# Patient Record
Sex: Male | Born: 1960
Health system: Southern US, Community
[De-identification: ages and names within clinical notes are randomized; demographics above are authoritative.]

## PROBLEM LIST (undated history)

## (undated) DIAGNOSIS — M1990 Unspecified osteoarthritis, unspecified site: Secondary | ICD-10-CM

## (undated) HISTORY — DX: Unspecified osteoarthritis, unspecified site: M19.90

---

## 2016-05-07 ENCOUNTER — Other Ambulatory Visit: Payer: Self-pay | Admitting: Infectious Disease

## 2016-05-07 ENCOUNTER — Ambulatory Visit
Admission: RE | Admit: 2016-05-07 | Discharge: 2016-05-07 | Disposition: A | Discharge status: Home / Self Care | Payer: No Typology Code available for payment source | Source: Ambulatory Visit | Attending: Infectious Disease | Admitting: Infectious Disease

## 2016-05-07 DIAGNOSIS — R9389 Abnormal findings on diagnostic imaging of other specified body structures: Secondary | ICD-10-CM

## 2017-10-07 ENCOUNTER — Encounter: Payer: Self-pay | Admitting: Internal Medicine

## 2017-10-07 ENCOUNTER — Ambulatory Visit (INDEPENDENT_AMBULATORY_CARE_PROVIDER_SITE_OTHER): Payer: BLUE CROSS/BLUE SHIELD | Admitting: Internal Medicine

## 2017-10-07 VITALS — BP 110/80 | HR 76 | Temp 98.1°F | Ht 68.0 in | Wt 142.0 lb

## 2017-10-07 DIAGNOSIS — R351 Nocturia: Secondary | ICD-10-CM | POA: Diagnosis not present

## 2017-10-07 DIAGNOSIS — M7918 Myalgia, other site: Secondary | ICD-10-CM | POA: Diagnosis not present

## 2017-10-07 DIAGNOSIS — Z125 Encounter for screening for malignant neoplasm of prostate: Secondary | ICD-10-CM | POA: Diagnosis not present

## 2017-10-07 DIAGNOSIS — Z23 Encounter for immunization: Secondary | ICD-10-CM | POA: Diagnosis not present

## 2017-10-07 DIAGNOSIS — Z0000 Encounter for general adult medical examination without abnormal findings: Secondary | ICD-10-CM | POA: Diagnosis not present

## 2017-10-07 DIAGNOSIS — Z13220 Encounter for screening for lipoid disorders: Secondary | ICD-10-CM

## 2017-10-07 LAB — POCT URINALYSIS DIPSTICK
APPEARANCE: NORMAL
BILIRUBIN UA: NEGATIVE
Blood, UA: NEGATIVE
GLUCOSE UA: NEGATIVE
Ketones, UA: NEGATIVE
Leukocytes, UA: NEGATIVE
Nitrite, UA: NEGATIVE
Odor: NORMAL
Protein, UA: NEGATIVE
Spec Grav, UA: 1.015 (ref 1.010–1.025)
Urobilinogen, UA: 0.2 E.U./dL
pH, UA: 6.5 (ref 5.0–8.0)

## 2017-10-07 MED ORDER — MELOXICAM 15 MG PO TABS: ORAL_TABLET | ORAL | 2 refills | Status: DC

## 2017-10-07 MED ORDER — MELOXICAM 15 MG PO TABS: 15.0000 mg | ORAL_TABLET | Freq: Every day | ORAL | 0 refills | Status: DC

## 2017-10-07 NOTE — Patient Instructions (Signed)
It was a pleasure to see you today.  Flu vaccine given.  Fasting labs drawn and are pending.  Further instructions to follow.

## 2017-10-07 NOTE — Progress Notes (Signed)
   Subjective:    Patient ID: Jake Pierce, male    DOB: 1960-03-23, 57 y.o.   MRN: [id number]  HPI 56 year old [place] Male presents for first time today accompanied by interpreter for health maintenance exam and evaluation of medical issues. Daughter  and son-in-law are patients here.   No history of serious illnesses accidents or operations.  No known drug allergies.  Only takes over-the-counter medication for musculoskeletal pain.  A long time ago in [place] he apparently had an episode of hemoptysis.  At that time he was smoking.  Patient says he has had back and neck pain for over 10 years.  He smoked a pack of cigarettes per day for some 20 or 30 years but quit about 2 years ago.  Occasionally drinks alcohol.  Social history: He is married.  Wife is a homemaker.  He came from [place] city [place] in 2018 with his wife.  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  Family history: Father died at age 69 of heart and stroke issues.  Mother died at age 36 of diabetes.  5 brothers and 2 sisters who apparently all alive and well.  Says he tends to run low blood pressure.  Flu vaccine given today.  He is not sure about tetanus immunization.  There are no immunizations recorded in Epic on him.  I would suggest he get tetanus immunization update at next visit but interpreter says that he likely had tetanus immunization when he entered the country in 2018.       Review of Systems has left lower back pain without radiation to the leg.  Has to urinate perhaps 3 times a night but is able to go back to sleep.  No issues with abdominal pain or constipation.  No shortness of breath.     Objective:   Physical Exam Skin warm and dry.  Nodes none.  HEENT exam: TMs and pharynx are clear.  Dentition is fair.  Neck is supple.  No JVD thyromegaly or carotid bruits.  Chest clear to auscultation.  Cardiac exam regular rate and rhythm normal  S1 and S2 without murmurs or gallops.  Abdomen no hepatosplenomegaly masses or tenderness.  Prostate exam is normal without nodules.  Extremities without deformity or edema.  Neuro no focal deficits on brief neurological exam.  Straight leg raising is negative at 90 degrees bilaterally in lower extremity muscle strength is normal.       Assessment & Plan:  Low back pain likely mechanical from type of work he does.  Have prescribed Meloxicam.  Fasting labs drawn and pending  His blood pressure is a bit low systolically but he feels well and has no symptoms of orthostasis.  Routine health maintenance-labs drawn and pending.  Flu vaccine given.  Daughter requests that we check his Hepatitis B status.  Further instructions to follow once lab work is reviewed.  Nocturia-it does not keep him awake at night and we will continue to monitor.  His prostate does not seem to be very enlarged if at all.

## 2017-10-09 LAB — CBC WITH DIFFERENTIAL/PLATELET
BASOS ABS: 49 {cells}/uL (ref 0–200)
BASOS PCT: 0.9 %
EOS ABS: 637 {cells}/uL — AB (ref 15–500)
Eosinophils Relative: 11.8 %
HEMATOCRIT: 49.8 % (ref 38.5–50.0)
HEMOGLOBIN: 17.2 g/dL — AB (ref 13.2–17.1)
LYMPHS ABS: 1577 {cells}/uL (ref 850–3900)
MCH: 31.8 pg (ref 27.0–33.0)
MCHC: 34.5 g/dL (ref 32.0–36.0)
MCV: 92.1 fL (ref 80.0–100.0)
MONOS PCT: 6.7 %
MPV: 9.6 fL (ref 7.5–12.5)
NEUTROS ABS: 2776 {cells}/uL (ref 1500–7800)
Neutrophils Relative %: 51.4 %
Platelets: 191 10*3/uL (ref 140–400)
RBC: 5.41 10*6/uL (ref 4.20–5.80)
RDW: 12.3 % (ref 11.0–15.0)
Total Lymphocyte: 29.2 %
WBC: 5.4 10*3/uL (ref 3.8–10.8)
WBCMIX: 362 {cells}/uL (ref 200–950)

## 2017-10-09 LAB — COMPLETE METABOLIC PANEL WITH GFR
AG Ratio: 1.7 (calc) (ref 1.0–2.5)
ALT: 17 U/L (ref 9–46)
AST: 20 U/L (ref 10–35)
Albumin: 4.4 g/dL (ref 3.6–5.1)
Alkaline phosphatase (APISO): 66 U/L (ref 40–115)
BUN: 12 mg/dL (ref 7–25)
CALCIUM: 9.7 mg/dL (ref 8.6–10.3)
CO2: 31 mmol/L (ref 20–32)
CREATININE: 0.76 mg/dL (ref 0.70–1.33)
Chloride: 103 mmol/L (ref 98–110)
GFR, EST AFRICAN AMERICAN: 116 mL/min/{1.73_m2} (ref >=60)
GFR, EST NON AFRICAN AMERICAN: 100 mL/min/{1.73_m2} (ref >=60)
Globulin: 2.6 g/dL (calc) (ref 1.9–3.7)
Glucose, Bld: 89 mg/dL (ref 65–99)
POTASSIUM: 4.1 mmol/L (ref 3.5–5.3)
Sodium: 141 mmol/L (ref 135–146)
Total Bilirubin: 0.6 mg/dL (ref 0.2–1.2)
Total Protein: 7 g/dL (ref 6.1–8.1)

## 2017-10-09 LAB — LIPID PANEL
Cholesterol: 217 mg/dL — ABNORMAL HIGH (ref <200)
HDL: 45 mg/dL (ref >40)
LDL Cholesterol (Calc): 118 mg/dL (calc) — ABNORMAL HIGH
Non-HDL Cholesterol (Calc): 172 mg/dL (calc) — ABNORMAL HIGH (ref <130)
Total CHOL/HDL Ratio: 4.8 (calc) (ref <5.0)
Triglycerides: 378 mg/dL — ABNORMAL HIGH (ref <150)

## 2017-10-09 LAB — PSA: PSA: 0.7 ng/mL (ref <=4.0)

## 2017-10-09 LAB — HEPATITIS B E ANTIGEN: Hep B E Ag: NONREACTIVE

## 2017-10-09 LAB — HEPATITIS B SURFACE ANTIBODY,QUALITATIVE: HEP B S AB: REACTIVE — AB

## 2017-10-15 ENCOUNTER — Telehealth: Payer: Self-pay | Admitting: Internal Medicine

## 2017-10-15 MED ORDER — ROSUVASTATIN CALCIUM 5 MG PO TABS: 5.0000 mg | ORAL_TABLET | Freq: Every day | ORAL | 3 refills | Status: DC

## 2017-10-15 NOTE — Telephone Encounter (Signed)
Starting pt on Crestor for hyperlipidemia with follow up in 3 months

## 2018-01-04 ENCOUNTER — Other Ambulatory Visit: Payer: Self-pay

## 2018-01-04 MED ORDER — MELOXICAM 15 MG PO TABS: ORAL_TABLET | ORAL | 5 refills | Status: DC

## 2019-03-16 ENCOUNTER — Telehealth: Payer: Self-pay | Admitting: Internal Medicine

## 2019-03-16 NOTE — Telephone Encounter (Signed)
Received phone call that patient was changing PCP's because they could be seen sooner, and they accept their insurance.

## 2019-04-05 ENCOUNTER — Other Ambulatory Visit: Payer: Self-pay

## 2019-04-05 ENCOUNTER — Encounter: Payer: Self-pay | Admitting: Emergency Medicine

## 2019-04-05 ENCOUNTER — Ambulatory Visit (INDEPENDENT_AMBULATORY_CARE_PROVIDER_SITE_OTHER): Payer: Self-pay | Admitting: Emergency Medicine

## 2019-04-05 VITALS — BP 132/77 | HR 93 | Temp 98.3°F | Resp 16 | Ht 68.9 in | Wt 145.0 lb

## 2019-04-05 DIAGNOSIS — R634 Abnormal weight loss: Secondary | ICD-10-CM

## 2019-04-05 DIAGNOSIS — G8929 Other chronic pain: Secondary | ICD-10-CM | POA: Insufficient documentation

## 2019-04-05 DIAGNOSIS — Z130 Encounter for screening for diseases of the blood and blood-forming organs and certain disorders involving the immune mechanism: Secondary | ICD-10-CM

## 2019-04-05 DIAGNOSIS — Z0001 Encounter for general adult medical examination with abnormal findings: Secondary | ICD-10-CM

## 2019-04-05 DIAGNOSIS — Z0000 Encounter for general adult medical examination without abnormal findings: Secondary | ICD-10-CM

## 2019-04-05 DIAGNOSIS — M545 Low back pain, unspecified: Secondary | ICD-10-CM

## 2019-04-05 DIAGNOSIS — Z1329 Encounter for screening for other suspected endocrine disorder: Secondary | ICD-10-CM

## 2019-04-05 DIAGNOSIS — E785 Hyperlipidemia, unspecified: Secondary | ICD-10-CM | POA: Insufficient documentation

## 2019-04-05 DIAGNOSIS — M549 Dorsalgia, unspecified: Secondary | ICD-10-CM

## 2019-04-05 DIAGNOSIS — Z13228 Encounter for screening for other metabolic disorders: Secondary | ICD-10-CM

## 2019-04-05 LAB — POCT URINALYSIS DIP (MANUAL ENTRY)
Bilirubin, UA: NEGATIVE
Glucose, UA: NEGATIVE mg/dL
Ketones, POC UA: NEGATIVE mg/dL
Leukocytes, UA: NEGATIVE
Nitrite, UA: NEGATIVE
Protein Ur, POC: NEGATIVE mg/dL
Spec Grav, UA: >=1.03 — AB (ref 1.010–1.025)
Urobilinogen, UA: 0.2 E.U./dL
pH, UA: 7 (ref 5.0–8.0)

## 2019-04-05 MED ORDER — MELOXICAM 15 MG PO TABS: ORAL_TABLET | ORAL | 0 refills | Status: DC

## 2019-04-05 MED ORDER — ROSUVASTATIN CALCIUM 5 MG PO TABS: 5.0000 mg | ORAL_TABLET | Freq: Every day | ORAL | 3 refills | Status: DC

## 2019-04-05 MED ORDER — ROSUVASTATIN CALCIUM 10 MG PO TABS: 10.0000 mg | ORAL_TABLET | Freq: Every day | ORAL | 3 refills | Status: AC

## 2019-04-05 MED ORDER — MELOXICAM 15 MG PO TABS: ORAL_TABLET | ORAL | 5 refills | Status: DC

## 2019-04-05 NOTE — Patient Instructions (Addendum)
   If you have lab work done today you will be contacted with your lab results within the next 2 weeks.  If you have not heard from us then please contact us. The fastest way to get your results is to register for My Chart.   IF you received an x-ray today, you will receive an invoice from [facility]. Please contact [facility] at [phone] with questions or concerns regarding your invoice.   IF you received labwork today, you will receive an invoice from [facility]. Please contact [facility] at [phone] with questions or concerns regarding your invoice.   Our billing staff will not be able to assist you with questions regarding bills from these companies.  You will be contacted with the lab results as soon as they are available. The fastest way to get your results is to activate your My Chart account. Instructions are located on the last page of this paperwork. If you have not heard from us regarding the results in 2 weeks, please contact this office.      Health Maintenance, Male Adopting a healthy lifestyle and getting preventive care are important in promoting health and wellness. Ask your health care provider about:  The right schedule for you to have regular tests and exams.  Things you can do on your own to prevent diseases and keep yourself healthy. What should I know about diet, weight, and exercise? Eat a healthy diet   Eat a diet that includes plenty of vegetables, fruits, low-fat dairy products, and lean protein.  Do not eat a lot of foods that are high in solid fats, added sugars, or sodium. Maintain a healthy weight Body mass index (BMI) is a measurement that can be used to identify possible weight problems. It estimates body fat based on height and weight. Your health care provider can help determine your BMI and help you achieve or maintain a healthy weight. Get regular exercise Get regular exercise. This is one of the most important things you  can do for your health. Most adults should:  Exercise for at least 150 minutes each week. The exercise should increase your heart rate and make you sweat (moderate-intensity exercise).  Do strengthening exercises at least twice a week. This is in addition to the moderate-intensity exercise.  Spend less time sitting. Even light physical activity can be beneficial. Watch cholesterol and blood lipids Have your blood tested for lipids and cholesterol at 61 years of age, then have this test every 5 years. You may need to have your cholesterol levels checked more often if:  Your lipid or cholesterol levels are high.  You are older than 61 years of age.  You are at high risk for heart disease. What should I know about cancer screening? Many types of cancers can be detected early and may often be prevented. Depending on your health history and family history, you may need to have cancer screening at various ages. This may include screening for:  Colorectal cancer.  Prostate cancer.  Skin cancer.  Lung cancer. What should I know about heart disease, diabetes, and high blood pressure? Blood pressure and heart disease  High blood pressure causes heart disease and increases the risk of stroke. This is more likely to develop in people who have high blood pressure readings, are of African descent, or are overweight.  Talk with your health care provider about your target blood pressure readings.  Have your blood pressure checked: ? Every 3-5 years if you are 18-39   years of age. ? Every year if you are 40 years old or older.  If you are between the ages of 65 and 75 and are a current or former smoker, ask your health care provider if you should have a one-time screening for abdominal aortic aneurysm (AAA). Diabetes Have regular diabetes screenings. This checks your fasting blood sugar level. Have the screening done:  Once every three years after age 45 if you are at a normal weight and have  a low risk for diabetes.  More often and at a younger age if you are overweight or have a high risk for diabetes. What should I know about preventing infection? Hepatitis B If you have a higher risk for hepatitis B, you should be screened for this virus. Talk with your health care provider to find out if you are at risk for hepatitis B infection. Hepatitis C Blood testing is recommended for:  Everyone born from 1945 through 1965.  Anyone with known risk factors for hepatitis C. Sexually transmitted infections (STIs)  You should be screened each year for STIs, including gonorrhea and chlamydia, if: ? You are sexually active and are younger than 61 years of age. ? You are older than 61 years of age and your health care provider tells you that you are at risk for this type of infection. ? Your sexual activity has changed since you were last screened, and you are at increased risk for chlamydia or gonorrhea. Ask your health care provider if you are at risk.  Ask your health care provider about whether you are at high risk for HIV. Your health care provider may recommend a prescription medicine to help prevent HIV infection. If you choose to take medicine to prevent HIV, you should first get tested for HIV. You should then be tested every 3 months for as long as you are taking the medicine. Follow these instructions at home: Lifestyle  Do not use any products that contain nicotine or tobacco, such as cigarettes, e-cigarettes, and chewing tobacco. If you need help quitting, ask your health care provider.  Do not use street drugs.  Do not share needles.  Ask your health care provider for help if you need support or information about quitting drugs. Alcohol use  Do not drink alcohol if your health care provider tells you not to drink.  If you drink alcohol: ? Limit how much you have to 0-2 drinks a day. ? Be aware of how much alcohol is in your drink. In the [place] one drink equals one 12  oz bottle of beer (355 mL), one 5 oz glass of wine (148 mL), or one 1 oz glass of hard liquor (44 mL). General instructions  Schedule regular health, dental, and eye exams.  Stay current with your vaccines.  Tell your health care provider if: ? You often feel depressed. ? You have ever been abused or do not feel safe at home. Summary  Adopting a healthy lifestyle and getting preventive care are important in promoting health and wellness.  Follow your health care provider's instructions about healthy diet, exercising, and getting tested or screened for diseases.  Follow your health care provider's instructions on monitoring your cholesterol and blood pressure. This information is not intended to replace advice given to you by your health care provider. Make sure you discuss any questions you have with your health care provider. Document Revised: 01/06/2018 Document Reviewed: 01/06/2018 Elsevier Patient Education  2020 Elsevier Inc.  

## 2019-04-05 NOTE — Progress Notes (Signed)
Jake Pierce 59 y.o.   Chief Complaint  Patient presents with  . Establish Care    loss of weight  . Medication Refill    Meloxicam for back pain and Rosuvastatin    HISTORY OF PRESENT ILLNESS: This is a 59 y.o. male first visit to this office, here to establish care with me. Has a history of dyslipidemia, on Crestor 5 mg daily. Also has a history of chronic lumbar pain most likely related to very physical work.  Patient is a Corporate investment banker. Some concern about weight loss. Wt Readings from Last 3 Encounters:  04/05/19 145 lb (65.8 kg)  10/07/17 142 lb (64.4 kg)  Patient has no other complaints or medical concerns.  Using interpreter for [place] language. Patient states he feels fine, has good appetite, is eating well.  No urinary or bowel symptoms.   HPI   Prior to Admission medications   Medication Sig Start Date End Date Taking? Authorizing Provider  meloxicam (MOBIC) 15 MG tablet One po daily with food for musculoskeletal pain 04/05/19  Yes Shylo Zamor, Eilleen Kempf, MD  rosuvastatin (CRESTOR) 5 MG tablet Take 1 tablet (5 mg total) by mouth daily. 04/05/19  Yes Chundra Sauerwein, Eilleen Kempf, MD    No Known Allergies  There are no problems to display for this patient.   History reviewed. No pertinent past medical history.  History reviewed. No pertinent surgical history.  Social History   Socioeconomic History  . Marital status: Married    Spouse name: Not on file  . Number of children: Not on file  . Years of education: Not on file  . Highest education level: Not on file  Occupational History  . Not on file  Tobacco Use  . Smoking status: Former Games developer  . Smokeless tobacco: Never Used  Substance and Sexual Activity  . Alcohol use: Not Currently  . Drug use: Not on file  . Sexual activity: Not on file  Other Topics Concern  . Not on file  Social History Narrative  . Not on file   Social Determinants of Health   Financial Resource Strain:   . Difficulty  of Paying Living Expenses: Not on file  Food Insecurity:   . Worried About Programme researcher, broadcasting/film/video in the Last Year: Not on file  . Ran Out of Food in the Last Year: Not on file  Transportation Needs:   . Lack of Transportation (Medical): Not on file  . Lack of Transportation (Non-Medical): Not on file  Physical Activity:   . Days of Exercise per Week: Not on file  . Minutes of Exercise per Session: Not on file  Stress:   . Feeling of Stress : Not on file  Social Connections:   . Frequency of Communication with Friends and Family: Not on file  . Frequency of Social Gatherings with Friends and Family: Not on file  . Attends Religious Services: Not on file  . Active Member of Clubs or Organizations: Not on file  . Attends Banker Meetings: Not on file  . Marital Status: Not on file  Intimate Partner Violence:   . Fear of Current or Ex-Partner: Not on file  . Emotionally Abused: Not on file  . Physically Abused: Not on file  . Sexually Abused: Not on file    History reviewed. No pertinent family history.   Review of Systems  Constitutional: Negative.  Negative for chills and fever.  HENT: Negative.  Negative for congestion and sore throat.  Eyes: Negative.   Respiratory: Negative.  Negative for cough, hemoptysis and shortness of breath.   Cardiovascular: Negative.  Negative for chest pain and palpitations.  Gastrointestinal: Negative.  Negative for abdominal pain, blood in stool, diarrhea, melena, nausea and vomiting.  Genitourinary: Negative.  Negative for dysuria and hematuria.  Musculoskeletal: Positive for back pain. Negative for myalgias and neck pain.  Skin: Negative.  Negative for rash.  Neurological: Negative for dizziness and headaches.  Endo/Heme/Allergies: Negative.   All other systems reviewed and are negative.  Today's Vitals   04/05/19 1325  BP: 132/77  Pulse: 93  Resp: 16  Temp: 98.3 F (36.8 C)  TempSrc: Temporal  SpO2: 98%  Weight: 145 lb  (65.8 kg)  Height: 5' 8.9" (1.75 m)   Body mass index is 21.48 kg/m.   Physical Exam Vitals reviewed.  Constitutional:      Appearance: Normal appearance.  HENT:     Head: Normocephalic.  Eyes:     Extraocular Movements: Extraocular movements intact.     Conjunctiva/sclera: Conjunctivae normal.     Pupils: Pupils are equal, round, and reactive to light.  Neck:     Vascular: No carotid bruit.  Cardiovascular:     Rate and Rhythm: Normal rate and regular rhythm.     Pulses: Normal pulses.     Heart sounds: Normal heart sounds.  Pulmonary:     Effort: Pulmonary effort is normal.     Breath sounds: Normal breath sounds.  Abdominal:     General: Bowel sounds are normal. There is no distension.     Palpations: Abdomen is soft.     Tenderness: There is no abdominal tenderness.  Musculoskeletal:        General: Normal range of motion.     Cervical back: Normal, normal range of motion and neck supple. No tenderness.     Thoracic back: Normal.     Lumbar back: Normal.  Lymphadenopathy:     Cervical: No cervical adenopathy.  Skin:    General: Skin is warm and dry.     Capillary Refill: Capillary refill takes less than 2 seconds.  Neurological:     General: No focal deficit present.     Mental Status: He is alert and oriented to person, place, and time.  Psychiatric:        Mood and Affect: Mood normal.        Behavior: Behavior normal.      ASSESSMENT & PLAN: Jake was seen today for establish care and medication refill.  Diagnoses and all orders for this visit:  Routine general medical examination at a health care facility -     Comprehensive metabolic panel -     CBC with Differential/Platelet  Dyslipidemia -     Lipid panel -     Discontinue: rosuvastatin (CRESTOR) 5 MG tablet; Take 1 tablet (5 mg total) by mouth daily. -     rosuvastatin (CRESTOR) 10 MG tablet; Take 1 tablet (10 mg total) by mouth daily.  Chronic bilateral low back pain without sciatica -      POCT urinalysis dipstick  Musculoskeletal back pain -     Discontinue: meloxicam (MOBIC) 15 MG tablet; One po daily with food for musculoskeletal pain -     meloxicam (MOBIC) 15 MG tablet; Daily as needed for low back pain  Loss of weight -     PSA(Must document that pt has been informed of limitations of PSA testing.) -     TSH  Screening for deficiency anemia  Screening for endocrine, metabolic and immunity disorder -     PSA(Must document that pt has been informed of limitations of PSA testing.) -     TSH    Patient Instructions       If you have lab work done today you will be contacted with your lab results within the next 2 weeks.  If you have not heard from us then please contact us. The fastest way to get your results is to register for My Chart.   IF you received an x-ray today, you will receive an invoice [facility]. Please contact [facility] at [phone] with questions or concerns regarding your invoice.   IF you received labwork today, you will receive an invoice from [facility]. Please contact [facility] at [phone] with questions or concerns regarding your invoice.   Our billing staff will not be able to assist you with questions regarding bills from these companies.  You will be contacted with the lab results as soon as they are available. The fastest way to get your results is to activate your My Chart account. Instructions are located on the last page of this paperwork. If you have not heard from us regarding the results in 2 weeks, please contact this office.      Health Maintenance, Male Adopting a healthy lifestyle and getting preventive care are important in promoting health and wellness. Ask your health care provider about:  The right schedule for you to have regular tests and exams.  Things you can do on your own to prevent diseases and keep yourself healthy. What should I know about diet, weight, and exercise? Eat a  healthy diet   Eat a diet that includes plenty of vegetables, fruits, low-fat dairy products, and lean protein.  Do not eat a lot of foods that are high in solid fats, added sugars, or sodium. Maintain a healthy weight Body mass index (BMI) is a measurement that can be used to identify possible weight problems. It estimates body fat based on height and weight. Your health care provider can help determine your BMI and help you achieve or maintain a healthy weight. Get regular exercise Get regular exercise. This is one of the most important things you can do for your health. Most adults should:  Exercise for at least 150 minutes each week. The exercise should increase your heart rate and make you sweat (moderate-intensity exercise).  Do strengthening exercises at least twice a week. This is in addition to the moderate-intensity exercise.  Spend less time sitting. Even light physical activity can be beneficial. Watch cholesterol and blood lipids Have your blood tested for lipids and cholesterol at 61 years of age, then have this test every 5 years. You may need to have your cholesterol levels checked more often if:  Your lipid or cholesterol levels are high.  You are older than 61 years of age.  You are at high risk for heart disease. What should I know about cancer screening? Many types of cancers can be detected early and may often be prevented. Depending on your health history and family history, you may need to have cancer screening at various ages. This may include screening for:  Colorectal cancer.  Prostate cancer.  Skin cancer.  Lung cancer. What should I know about heart disease, diabetes, and high blood pressure? Blood pressure and heart disease  High blood pressure causes heart disease and increases the risk of stroke. This is more likely to  develop in people who have high blood pressure readings, are of African descent, or are overweight.  Talk with your health care  provider about your target blood pressure readings.  Have your blood pressure checked: ? Every 3-5 years if you are 74-28 years of age. ? Every year if you are 88 years old or older.  If you are between the ages of 62 and 60 and are a current or former smoker, ask your health care provider if you should have a one-time screening for abdominal aortic aneurysm (AAA). Diabetes Have regular diabetes screenings. This checks your fasting blood sugar level. Have the screening done:  Once every three years after age 71 if you are at a normal weight and have a low risk for diabetes.  More often and at a younger age if you are overweight or have a high risk for diabetes. What should I know about preventing infection? Hepatitis B If you have a higher risk for hepatitis B, you should be screened for this virus. Talk with your health care provider to find out if you are at risk for hepatitis B infection. Hepatitis C Blood testing is recommended for:  Everyone born from [birth year] through 1965.  Anyone with known risk factors for hepatitis C. Sexually transmitted infections (STIs)  You should be screened each year for STIs, including gonorrhea and chlamydia, if: ? You are sexually active and are younger than 61 years of age. ? You are older than 61 years of age and your health care provider tells you that you are at risk for this type of infection. ? Your sexual activity has changed since you were last screened, and you are at increased risk for chlamydia or gonorrhea. Ask your health care provider if you are at risk.  Ask your health care provider about whether you are at high risk for HIV. Your health care provider may recommend a prescription medicine to help prevent HIV infection. If you choose to take medicine to prevent HIV, you should first get tested for HIV. You should then be tested every 3 months for as long as you are taking the medicine. Follow these instructions at home: Lifestyle  Do not  use any products that contain nicotine or tobacco, such as cigarettes, e-cigarettes, and chewing tobacco. If you need help quitting, ask your health care provider.  Do not use street drugs.  Do not share needles.  Ask your health care provider for help if you need support or information about quitting drugs. Alcohol use  Do not drink alcohol if your health care provider tells you not to drink.  If you drink alcohol: ? Limit how much you have to 0-2 drinks a day. ? Be aware of how much alcohol is in your drink. In the [place] one drink equals one 12 oz bottle of beer (355 mL), one 5 oz glass of wine (148 mL), or one 1 oz glass of hard liquor (44 mL). General instructions  Schedule regular health, dental, and eye exams.  Stay current with your vaccines.  Tell your health care provider if: ? You often feel depressed. ? You have ever been abused or do not feel safe at home. Summary  Adopting a healthy lifestyle and getting preventive care are important in promoting health and wellness.  Follow your health care provider's instructions about healthy diet, exercising, and getting tested or screened for diseases.  Follow your health care provider's instructions on monitoring your cholesterol and blood pressure. This information is not intended  to replace advice given to you by your health care provider. Make sure you discuss any questions you have with your health care provider. Document Revised: 01/06/2018 Document Reviewed: 01/06/2018 Elsevier Patient Education  2020 Elsevier Inc.      Edwina Barth, MD [facility]

## 2019-04-06 ENCOUNTER — Encounter: Payer: Self-pay | Admitting: Emergency Medicine

## 2019-04-06 ENCOUNTER — Encounter: Payer: Self-pay | Admitting: Radiology

## 2019-04-06 LAB — CBC WITH DIFFERENTIAL/PLATELET
Basophils Absolute: 0 10*3/uL (ref 0.0–0.2)
Basos: 1 %
EOS (ABSOLUTE): 0.5 10*3/uL — ABNORMAL HIGH (ref 0.0–0.4)
Eos: 9 %
Hematocrit: 45.2 % (ref 37.5–51.0)
Hemoglobin: 15.8 g/dL (ref 13.0–17.7)
Immature Grans (Abs): 0 10*3/uL (ref 0.0–0.1)
Immature Granulocytes: 1 %
Lymphocytes Absolute: 1.3 10*3/uL (ref 0.7–3.1)
Lymphs: 24 %
MCH: 33 pg (ref 26.6–33.0)
MCHC: 35 g/dL (ref 31.5–35.7)
MCV: 94 fL (ref 79–97)
Monocytes Absolute: 0.5 10*3/uL (ref 0.1–0.9)
Monocytes: 9 %
Neutrophils Absolute: 3.1 10*3/uL (ref 1.4–7.0)
Neutrophils: 56 %
Platelets: 189 10*3/uL (ref 150–450)
RBC: 4.79 x10E6/uL (ref 4.14–5.80)
RDW: 12.8 % (ref 11.6–15.4)
WBC: 5.4 10*3/uL (ref 3.4–10.8)

## 2019-04-06 LAB — COMPREHENSIVE METABOLIC PANEL
ALT: 18 IU/L (ref 0–44)
AST: 19 IU/L (ref 0–40)
Albumin/Globulin Ratio: 1.8 (ref 1.2–2.2)
Albumin: 4.2 g/dL (ref 3.8–4.9)
Alkaline Phosphatase: 85 IU/L (ref 39–117)
BUN/Creatinine Ratio: 25 — ABNORMAL HIGH (ref 10–24)
BUN: 17 mg/dL (ref 8–27)
Bilirubin Total: 0.3 mg/dL (ref 0.0–1.2)
CO2: 23 mmol/L (ref 20–29)
Calcium: 9.3 mg/dL (ref 8.6–10.2)
Chloride: 104 mmol/L (ref 96–106)
Creatinine, Ser: 0.68 mg/dL — ABNORMAL LOW (ref 0.76–1.27)
GFR calc Af Amer: 120 mL/min/{1.73_m2} (ref >59)
GFR calc non Af Amer: 104 mL/min/{1.73_m2} (ref >59)
Globulin, Total: 2.4 g/dL (ref 1.5–4.5)
Glucose: 111 mg/dL — ABNORMAL HIGH (ref 65–99)
Potassium: 3.8 mmol/L (ref 3.5–5.2)
Sodium: 139 mmol/L (ref 134–144)
Total Protein: 6.6 g/dL (ref 6.0–8.5)

## 2019-04-06 LAB — LIPID PANEL
Chol/HDL Ratio: 3.9 ratio (ref 0.0–5.0)
Cholesterol, Total: 221 mg/dL — ABNORMAL HIGH (ref 100–199)
HDL: 57 mg/dL (ref >39)
LDL Chol Calc (NIH): 126 mg/dL — ABNORMAL HIGH (ref 0–99)
Triglycerides: 218 mg/dL — ABNORMAL HIGH (ref 0–149)
VLDL Cholesterol Cal: 38 mg/dL (ref 5–40)

## 2019-04-06 LAB — PSA: Prostate Specific Ag, Serum: 0.6 ng/mL (ref 0.0–4.0)

## 2019-04-06 LAB — TSH: TSH: 0.637 u[IU]/mL (ref 0.450–4.500)

## 2019-04-12 ENCOUNTER — Encounter: Payer: Self-pay | Admitting: *Deleted

## 2019-05-24 ENCOUNTER — Encounter: Payer: BLUE CROSS/BLUE SHIELD | Admitting: Internal Medicine

## 2019-09-26 ENCOUNTER — Other Ambulatory Visit: Payer: Self-pay

## 2019-09-26 ENCOUNTER — Telehealth: Payer: Self-pay | Admitting: Emergency Medicine

## 2019-09-27 ENCOUNTER — Encounter: Payer: Self-pay | Admitting: Emergency Medicine

## 2019-10-04 ENCOUNTER — Ambulatory Visit: Payer: 59 | Admitting: Emergency Medicine

## 2019-10-05 ENCOUNTER — Encounter: Payer: Self-pay | Admitting: Emergency Medicine

## 2019-10-12 ENCOUNTER — Telehealth: Payer: Self-pay | Admitting: Emergency Medicine

## 2020-04-24 ENCOUNTER — Encounter: Payer: 59 | Admitting: Emergency Medicine

## 2020-05-16 ENCOUNTER — Encounter: Payer: 59 | Admitting: Emergency Medicine

## 2020-07-04 ENCOUNTER — Other Ambulatory Visit: Payer: Self-pay | Admitting: Physician Assistant

## 2020-07-04 DIAGNOSIS — Z87891 Personal history of nicotine dependence: Secondary | ICD-10-CM

## 2020-08-10 ENCOUNTER — Other Ambulatory Visit: Payer: Self-pay

## 2020-08-10 ENCOUNTER — Ambulatory Visit
Admission: RE | Admit: 2020-08-10 | Discharge: 2020-08-10 | Disposition: A | Discharge status: Home / Self Care | Payer: 59 | Source: Ambulatory Visit | Attending: Physician Assistant | Admitting: Physician Assistant

## 2020-08-10 DIAGNOSIS — Z87891 Personal history of nicotine dependence: Secondary | ICD-10-CM

## 2021-05-27 ENCOUNTER — Ambulatory Visit
Admission: EM | Admit: 2021-05-27 | Discharge: 2021-05-27 | Disposition: A | Discharge status: Home / Self Care | Payer: 59

## 2021-05-27 ENCOUNTER — Emergency Department (HOSPITAL_BASED_OUTPATIENT_CLINIC_OR_DEPARTMENT_OTHER): Payer: 59 | Admitting: Radiology

## 2021-05-27 ENCOUNTER — Encounter: Payer: Self-pay | Admitting: Emergency Medicine

## 2021-05-27 ENCOUNTER — Other Ambulatory Visit: Payer: Self-pay

## 2021-05-27 ENCOUNTER — Encounter (HOSPITAL_BASED_OUTPATIENT_CLINIC_OR_DEPARTMENT_OTHER): Payer: Self-pay | Admitting: Emergency Medicine

## 2021-05-27 ENCOUNTER — Emergency Department (HOSPITAL_BASED_OUTPATIENT_CLINIC_OR_DEPARTMENT_OTHER)
Admission: EM | Admit: 2021-05-27 | Discharge: 2021-05-27 | Disposition: A | Discharge status: Home / Self Care | Payer: 59 | Attending: Emergency Medicine | Admitting: Emergency Medicine

## 2021-05-27 DIAGNOSIS — S61214A Laceration without foreign body of right ring finger without damage to nail, initial encounter: Secondary | ICD-10-CM | POA: Diagnosis not present

## 2021-05-27 DIAGNOSIS — S6991XA Unspecified injury of right wrist, hand and finger(s), initial encounter: Secondary | ICD-10-CM | POA: Diagnosis present

## 2021-05-27 DIAGNOSIS — W25XXXA Contact with sharp glass, initial encounter: Secondary | ICD-10-CM | POA: Diagnosis not present

## 2021-05-27 MED ORDER — CEPHALEXIN 250 MG PO CAPS
500.0000 mg | ORAL_CAPSULE | Freq: Once | ORAL | Status: AC
Start: 2021-05-27 — End: 2021-05-27
  Administered 2021-05-27: 500 mg via ORAL
  Filled 2021-05-27: qty 2

## 2021-05-27 MED ORDER — LIDOCAINE HCL (PF) 1 % IJ SOLN
10.0000 mL | Freq: Once | INTRAMUSCULAR | Status: AC
Start: 2021-05-27 — End: 2021-05-27
  Administered 2021-05-27: 10 mL via INTRADERMAL
  Filled 2021-05-27: qty 10

## 2021-05-27 MED ORDER — CEPHALEXIN 500 MG PO CAPS: 500.0000 mg | ORAL_CAPSULE | Freq: Two times a day (BID) | ORAL | 0 refills | Status: AC

## 2021-05-27 NOTE — ED Notes (Signed)
ED Provider at bedside. 

## 2021-05-27 NOTE — ED Triage Notes (Signed)
Pt was cut on glass in trash can when he was pushing trash down. Pt has bandage on his right 4th finger; was seen at urgent care and sent here. Pt reports numbness to his entire hand. Unknown date of last tetanus shot. Pt alert & oriented, using [place] interpreter.  ?

## 2021-05-27 NOTE — ED Provider Notes (Signed)
Patient presents to urgent care today complaining of a laceration on his right fourth finger that occurred after attempting to compress trash in the trash can.  Patient states he believes he may have cut his finger on a can that was in the trash can.  Patient does not recall the last time he had a Tdap vaccination.  Patient endorses numbness and decreased strength in that finger.  Limited physical examination confirms this as well as a 1.5 cm laceration approximately 2 mm deep on the medial aspect of the PIP of his right fourth finger.  Patient does demonstrate full range of motion but grip strength is decreased in that finger and patient has loss of pinprick sensation.  Wound was rewrapped in a nonstick Telfa pad and Coban.  Patient was advised to the emergency room for further evaluation of nerve damage secondary to the injury.  Patient is with a young woman who, to the best of my ability seems, to be his daughter-in-law, she agrees to take him to the emergency room now. ?  ?Theadora Rama Scales, PA-C ?05/27/21 1616 ? ?

## 2021-05-27 NOTE — ED Notes (Signed)
Pt NAD, a/ox4. Pt verbalizes understanding of all DC and f/u instructions in vietnamese with MD Trifan and interpreter service. All questions answered. Pt walks with steady gait to lobby at DC.  ? ?

## 2021-05-27 NOTE — Discharge Instructions (Addendum)
The stitches (4) need to be removed in 7-10 days (May 8th-10th).  This can be done at an urgent care or the doctor's office. ?

## 2021-05-27 NOTE — ED Notes (Signed)
Patient is being discharged from the Urgent Care and sent to the Emergency Department via Rosa Sanchez [facility] . Per Delman Cheadle, patient is in need of higher level of care due to Laceration and Loss of sesnsation. Patient is aware and verbalizes understanding of plan of care.  ?Vitals:  ? 05/27/21 1600  ?BP: 139/89  ?Pulse: 94  ?Resp: 16  ?Temp: (!) 97.4 ?F (36.3 ?C)  ?SpO2: 95%  ?  ?

## 2021-05-27 NOTE — ED Triage Notes (Addendum)
Patient presents with Laceration on RT hand.  ? ?Patient endorses numbness in hand. ? ?Patient placed his hand in the trash and was cut my a can.  ? ?Patient has hand wrapped currently.  ? ?Patient doesn't remember last Tdap vaccination.  ?

## 2021-05-27 NOTE — ED Provider Notes (Signed)
?[facility] EM[facility]PT ?Provider Note ? ? ?CSN: [id number] ?Arrival date & time: 05/27/21  1634 ? ?  ? ?History ? ?Chief Complaint  ?Patient presents with  ? Extremity Laceration  ? ? ?Jake Pierce is a 61 y.o. male who is vietnamese speaking presenting to the emergency department with an injury to his right fourth finger.  He reports that he was taking out the trash earlier today and he cut his finger on the edge of a sharp can.  He says he put a tourniquet on his finger and tied a piece of string around the base of the finger for approximately an hour to slow and stop the bleeding.  Subsequently when he took it off he was seen at urgent care, and he was having numbness of his entire finger, and he was referred to the ED for further evaluation due to this numbness.  He feels the sensation is improving and coming back and almost back to normal in the finger.  He is right-handed.  He does not actively work. ? ?HPI ? ?  ? ?Home Medications ?Prior to Admission medications   ?Medication Sig Start Date End Date Taking? Authorizing Provider  ?cephALEXin (KEFLEX) 500 MG capsule Take 1 capsule (500 mg total) by mouth 2 (two) times daily for 5 days. 05/27/21 06/01/21 Yes Dorell Gatlin, Kermit Balo, MD  ?rosuvastatin (CRESTOR) 10 MG tablet Take 1 tablet (10 mg total) by mouth daily. 04/05/19 05/27/21  Georgina Quint, MD  ?   ? ?Allergies    ?Patient has no known allergies.   ? ?Review of Systems   ?Review of Systems ? ?Physical Exam ?Updated Vital Signs ?BP (!) 162/96 (BP Location: Left Leg)   Pulse 87   Temp 97.9 ?F (36.6 ?C)   Resp 14   Ht 5\' 8"  (1.727 m)   Wt 71.2 kg   SpO2 100%   BMI 23.87 kg/m?  ?Physical Exam ?Constitutional:   ?   General: He is not in acute distress. ?HENT:  ?   Head: Normocephalic and atraumatic.  ?Eyes:  ?   Conjunctiva/sclera: Conjunctivae normal.  ?   Pupils: Pupils are equal, round, and reactive to light.  ?Cardiovascular:  ?   Rate and Rhythm: Normal rate and regular rhythm.   ?Pulmonary:  ?   Effort: Pulmonary effort is normal. No respiratory distress.  ?Abdominal:  ?   General: There is no distension.  ?   Tenderness: There is no abdominal tenderness.  ?Skin: ?   General: Skin is warm and dry.  ?   Comments: 1 cm laceration just proximal to the right 4th PIP ?Patient is able to fully extend all fingers, he is able to flex his 4th finger into a near fist but not complete ?Sensation (gross and fine touch) is preserved  ?Neurological:  ?   General: No focal deficit present.  ?   Mental Status: He is alert. Mental status is at baseline.  ?Psychiatric:     ?   Mood and Affect: Mood normal.     ?   Behavior: Behavior normal.  ? ? ?ED Results / Procedures / Treatments   ?Labs ?(all labs ordered are listed, but only abnormal results are displayed) ?Labs Reviewed - No data to display ? ?EKG ?None ? ?Radiology ?DG Finger Ring Right ? ?Result Date: 05/27/2021 ?CLINICAL DATA:  Chronic glass in trash can EXAM: RIGHT RING FINGER 2+V COMPARISON:  None. FINDINGS: There is no acute fracture or dislocation. Bony alignment is  normal. The joint spaces are preserved. There is no erosive change. There is no soft tissue gas or radiopaque foreign body. IMPRESSION: No acute fracture or dislocation.  No radiopaque foreign body. Electronically Signed   By: Lesia HausenPeter  Noone M.D.   On: 05/27/2021 17:40   ? ?Procedures ?Marland Kitchen..Laceration Repair ? ?Date/Time: 05/27/2021 6:15 PM ?Performed by: Terald Sleeperrifan, Shean Gerding J, MD ?Authorized by: Terald Sleeperrifan, Tarri Guilfoil J, MD  ? ?Consent:  ?  Consent obtained:  Verbal ?  Consent given by:  Patient ?  Risks, benefits, and alternatives were discussed: yes   ?  Risks discussed:  Pain and infection ?Universal protocol:  ?  Procedure explained and questions answered to patient or proxy's satisfaction: yes   ?  Immediately prior to procedure, a time out was called: yes   ?  Patient identity confirmed:  Arm band ?Anesthesia:  ?  Anesthesia method:  Nerve block ?  Block location:  Digital block ?  Block needle  gauge:  25 G ?  Block anesthetic:  Lidocaine 1% w/o epi ?  Block injection procedure:  Anatomic landmarks identified, anatomic landmarks palpated, introduced needle, negative aspiration for blood and incremental injection ?  Block outcome:  Anesthesia achieved ?Laceration details:  ?  Location:  Finger ?  Finger location:  R ring finger ?  Length (cm):  1 ?Pre-procedure details:  ?  Preparation:  Patient was prepped and draped in usual sterile fashion ?Exploration:  ?  Limited defect created (wound extended): no   ?  Imaging obtained: x-ray   ?  Imaging outcome: foreign body not noted   ?  Wound extent: no muscle damage noted and no vascular damage noted   ?  Contaminated: yes   ?Treatment:  ?  Area cleansed with:  Saline ?  Amount of cleaning:  Standard ?  Irrigation solution:  Sterile saline ?  Irrigation method:  Pressure wash ?  Debridement:  None ?Skin repair:  ?  Repair method:  Sutures ?  Suture size:  4-0 ?  Suture material:  Prolene ?  Suture technique:  Simple interrupted ?  Number of sutures:  4 ?Approximation:  ?  Approximation:  Close ?Repair type:  ?  Repair type:  Simple  ? ? ?Medications Ordered in ED ?Medications  ?lidocaine (PF) (XYLOCAINE) 1 % injection 10 mL (has no administration in time range)  ?cephALEXin (KEFLEX) capsule 500 mg (has no administration in time range)  ? ? ?ED Course/ Medical Decision Making/ A&P ?  ?                        ?Medical Decision Making ?Amount and/or Complexity of Data Reviewed ?Radiology: ordered. ? ?Risk ?Prescription drug management. ? ? ?Patient is an isolated injury to his finger.  X-rays of the finger ordered and reviewed, no retained foreign body or underlying fracture.  He is grossly neurovascularly intact on exam, no active bleeding, there is some question about him being able to make a complete fist (flexion), which may be limited by the pain he is experiencing.  The wound was irrigated and closed with stitches the bedside.  He will be started on Keflex.   He received tetanus at urgent care per my review of the records.  A hand surgeon phone number was provided if he is still has difficulty fully flexing or extending the finger after the swelling is gone down in 2 to 3 days.  If he enemies translator service was used for the  entirety of my history and exam.  The patient's daughter was also present at the bedside.  They verbalized understanding ? ? ? ? ? ? ? ?Final Clinical Impression(s) / ED Diagnoses ?Final diagnoses:  ?Laceration of right ring finger without damage to nail, foreign body presence unspecified, initial encounter  ? ? ?Rx / DC Orders ?ED Discharge Orders   ? ?      Ordered  ?  cephALEXin (KEFLEX) 500 MG capsule  2 times daily       ? 05/27/21 1815  ? ?  ?  ? ?  ? ? ?  ?Terald Sleeper, MD ?05/27/21 1817 ? ?

## 2021-06-03 NOTE — Telephone Encounter (Signed)
NA

## 2021-06-05 ENCOUNTER — Ambulatory Visit
Admission: EM | Admit: 2021-06-05 | Discharge: 2021-06-05 | Disposition: A | Discharge status: Home / Self Care | Payer: 59

## 2021-06-05 NOTE — ED Notes (Addendum)
Four sutures removed with no issues.  ?

## 2021-06-05 NOTE — ED Triage Notes (Signed)
Patient presents today for suture removal. Patient has 4 sutures that are well healed, patient denies having any issues to the site.  ?

## 2021-06-05 NOTE — Discharge Instructions (Signed)
Please monitor for signs of infection (drainage, odor, redness, and swelling).  If you notice any of these symptoms or have complications to the area please return for re-evaluation.  

## 2021-07-10 ENCOUNTER — Other Ambulatory Visit: Payer: Self-pay | Admitting: Physician Assistant

## 2021-07-10 DIAGNOSIS — Z87891 Personal history of nicotine dependence: Secondary | ICD-10-CM

## 2021-08-14 ENCOUNTER — Ambulatory Visit: Payer: 59

## 2021-08-15 ENCOUNTER — Ambulatory Visit (INDEPENDENT_AMBULATORY_CARE_PROVIDER_SITE_OTHER): Payer: 59

## 2021-08-15 DIAGNOSIS — Z122 Encounter for screening for malignant neoplasm of respiratory organs: Secondary | ICD-10-CM | POA: Diagnosis not present

## 2021-08-15 DIAGNOSIS — Z87891 Personal history of nicotine dependence: Secondary | ICD-10-CM | POA: Diagnosis not present

## 2021-12-24 DIAGNOSIS — Z87891 Personal history of nicotine dependence: Secondary | ICD-10-CM | POA: Diagnosis not present

## 2021-12-24 DIAGNOSIS — E785 Hyperlipidemia, unspecified: Secondary | ICD-10-CM | POA: Diagnosis not present

## 2021-12-24 DIAGNOSIS — I10 Essential (primary) hypertension: Secondary | ICD-10-CM | POA: Diagnosis not present

## 2022-03-10 IMAGING — CT CT CHEST LUNG CANCER SCREENING LOW DOSE W/O CM
1 of 2 series · 14 of 32 positions shown, 18 images · non-contrast
Comparison: No priors.

CLINICAL DATA: 60-year-old male former smoker (quit in 9595) with
35 pack-year history of smoking. Lung cancer screening examination.

EXAM:
CT CHEST WITHOUT CONTRAST LOW-DOSE FOR LUNG CANCER SCREENING
TECHNIQUE: Multidetector CT imaging of the chest was performed following the
standard protocol without IV contrast.

[Series 3: ldct screen lung · axial · 0.65mm/px · z∈[-354,-65]mm · 14 of 319 slices shown, 18 images]
[im 15/319  mediastinal]
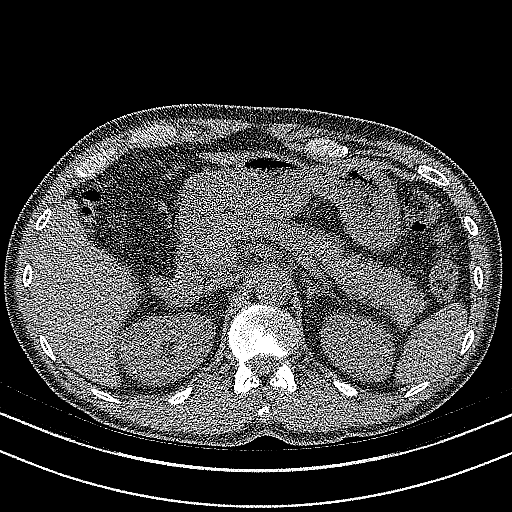
[im 15/319  lung]
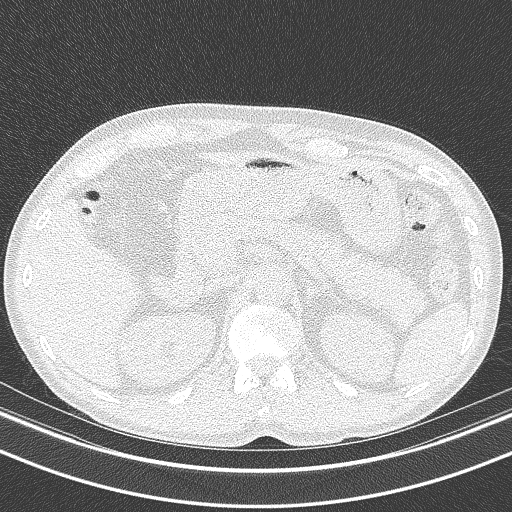
[im 44/319  lung]
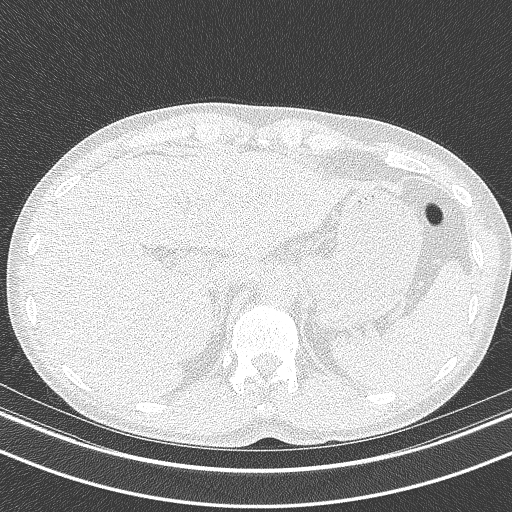
[im 73/319  lung]
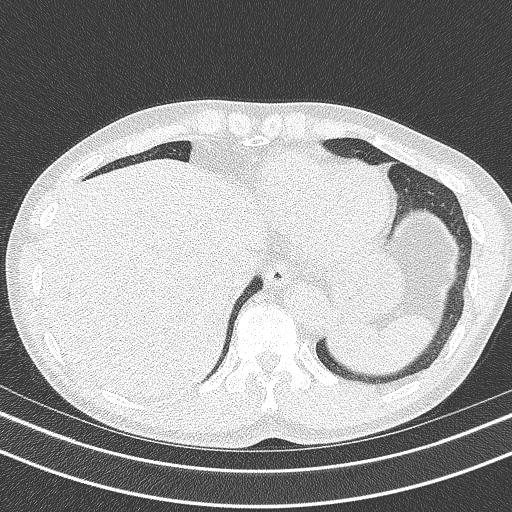
[im 87/319  lung]
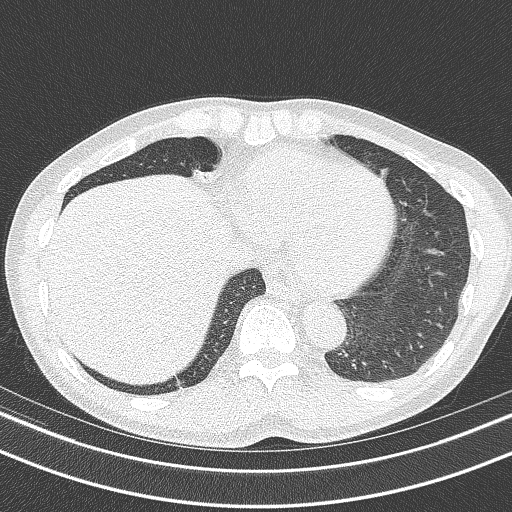
[im 102/319  mediastinal]
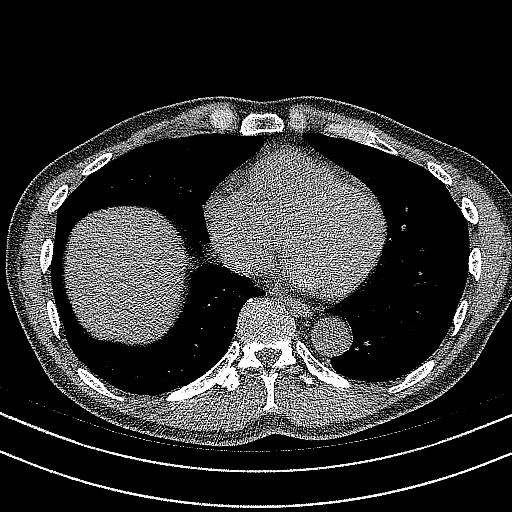
[im 102/319  lung]
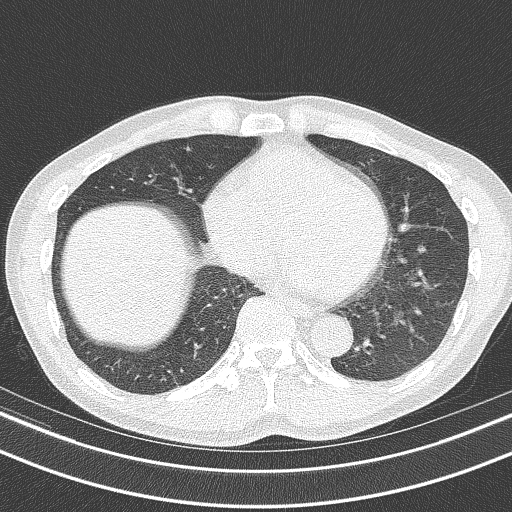
[im 131/319  lung]
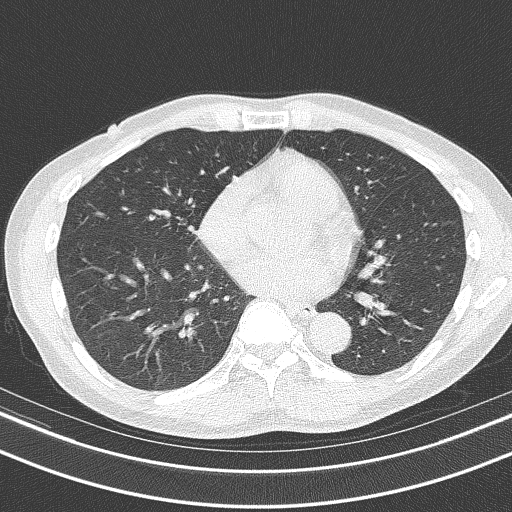
[im 150/319  lung]
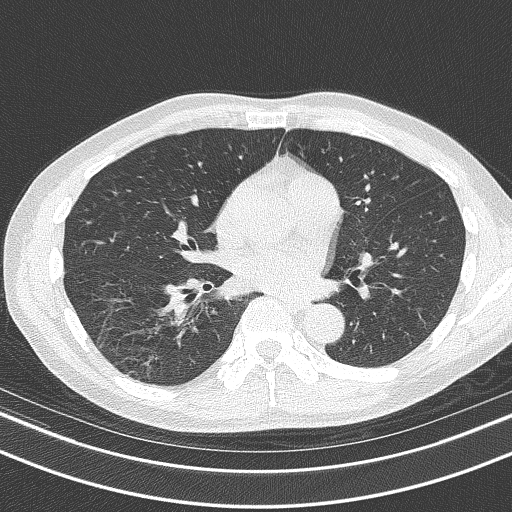
[im 160/319  lung]
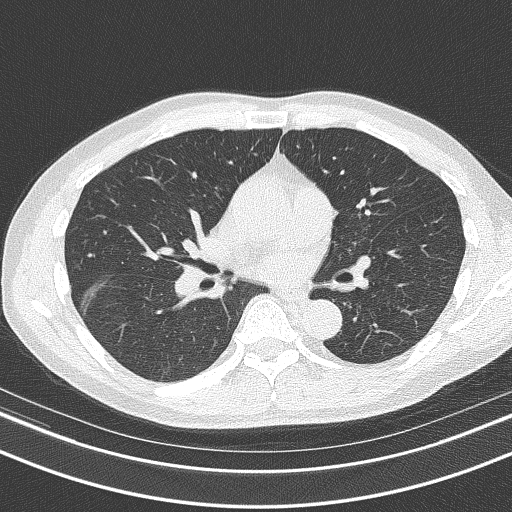
[im 188/319  mediastinal]
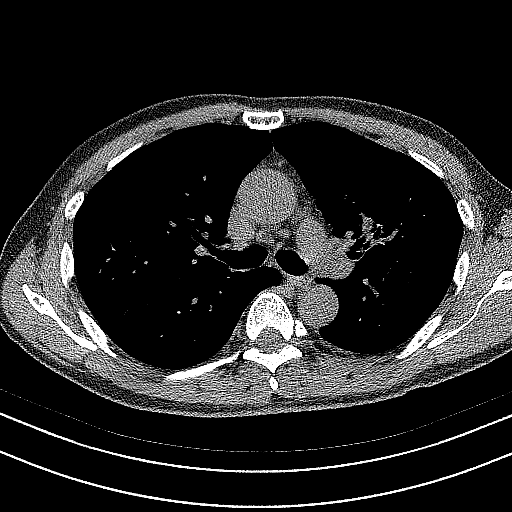
[im 188/319  lung]
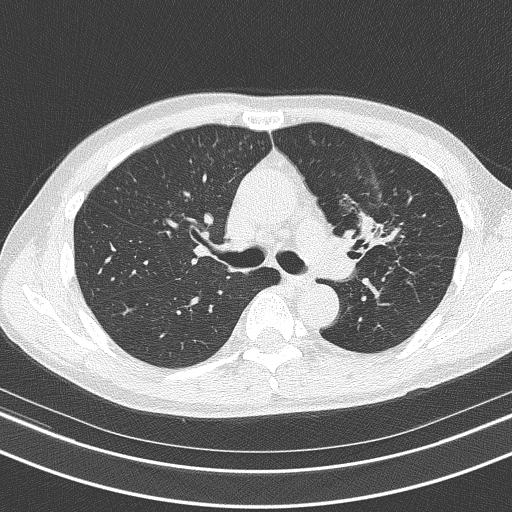
[im 217/319  lung]
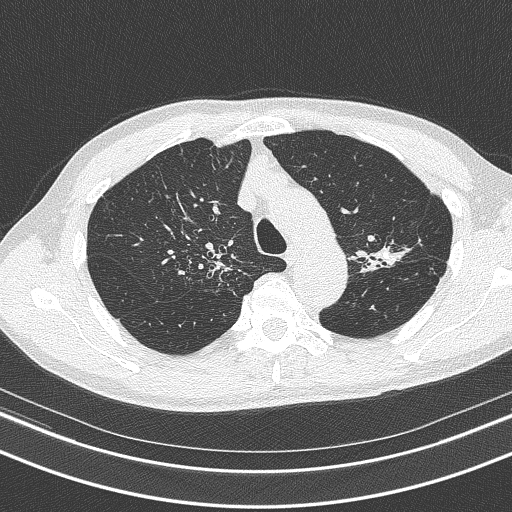
[im 239/319  lung]
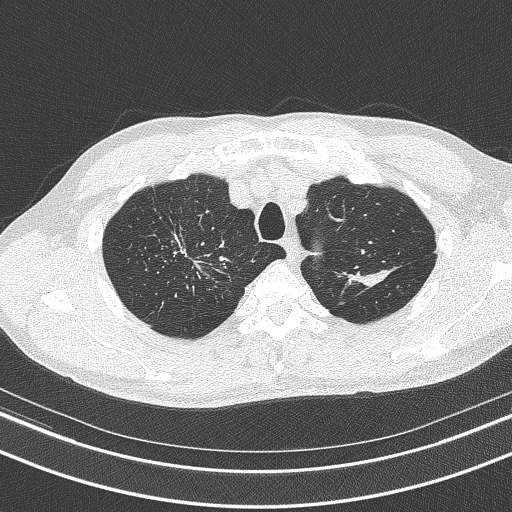
[im 246/319  lung]
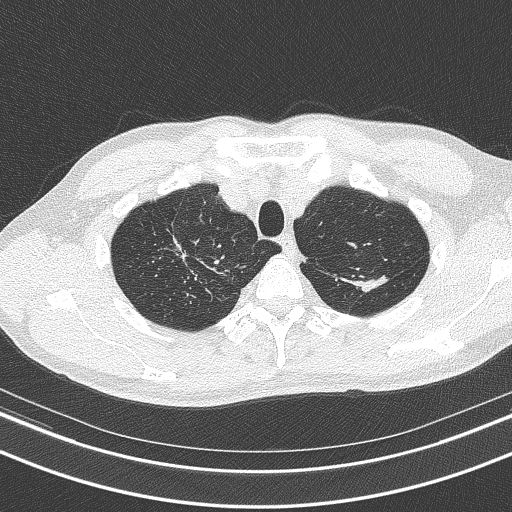
[im 275/319  mediastinal]
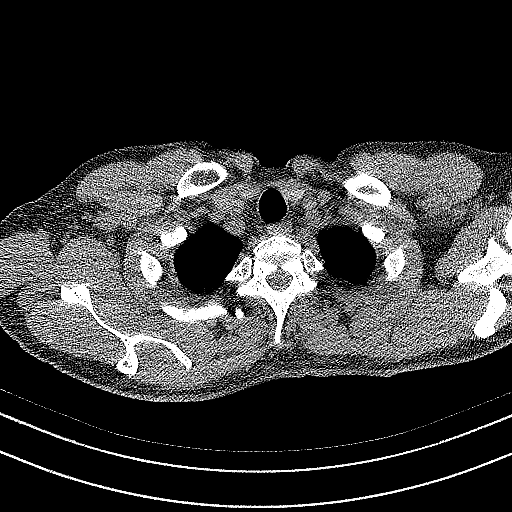
[im 275/319  lung]
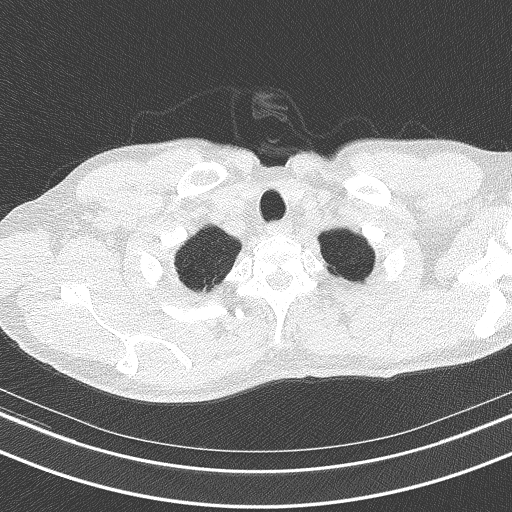
[im 304/319  lung]
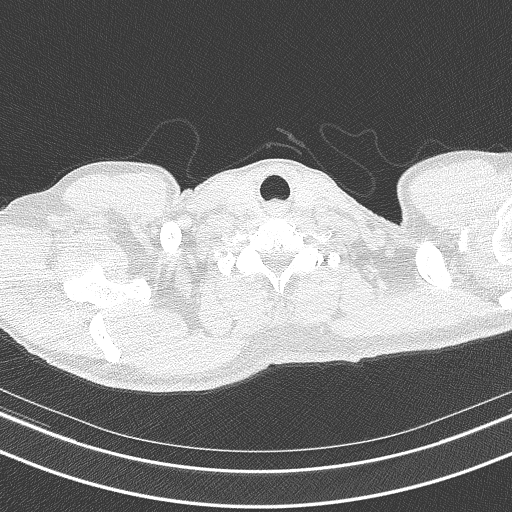

[14 of 32 positions shown; findings below may reference images not displayed]

FINDINGS: Cardiovascular: Heart size is normal. There is no significant
pericardial fluid, thickening or pericardial calcification. Aortic
atherosclerosis. No definite coronary artery calcifications.

Mediastinum/Nodes: No pathologically enlarged mediastinal or hilar
lymph nodes. Please note that accurate exclusion of hilar adenopathy
is limited on noncontrast CT scans. Esophagus is unremarkable in
appearance. No axillary lymphadenopathy.

Lungs/Pleura: Multiple small calcified granulomas and noncalcified
pulmonary nodules are noted throughout the lungs bilaterally. The
largest noncalcified pulmonary nodule is in the right upper lobe
(axial image 95 of series 3), with a volume derived mean diameter of
3.6 mm. In addition, there are substantial regions of architectural
distortion in the upper lobes of the lungs bilaterally with
associated areas of cylindrical bronchiectasis, thickening of the
peribronchovascular interstitium and extensive volume loss, most
compatible with chronic post infectious or inflammatory scarring. No
pleural effusions. Mild diffuse bronchial wall thickening with mild
centrilobular and paraseptal emphysema.

Upper Abdomen: Unremarkable.

Musculoskeletal: There are no aggressive appearing lytic or blastic
lesions noted in the visualized portions of the skeleton.
IMPRESSION: 1. Lung-RADS 2, benign appearance or behavior. Continue annual
screening with low-dose chest CT without contrast in 12 months.
2. Aortic atherosclerosis.
3. Mild diffuse bronchial wall thickening with mild centrilobular
and paraseptal emphysema; imaging findings suggestive of underlying
COPD.
4. Extensive post infectious or inflammatory scarring most evident
in the upper lobes of the lungs bilaterally. Clinical correlation
for prior history of tuberculosis is recommended. No imaging
findings to strongly suggest active pulmonary tuberculosis or other
active pulmonary infection at this time.

These results will be called to the ordering clinician or
representative by the Radiologist Assistant, and communication
documented in the PACS or [REDACTED].

Aortic Atherosclerosis (6XRXO-WD5.5) and Emphysema (6XRXO-VU1.V).

## 2022-12-25 IMAGING — DX DG FINGER RING 2+V*R*
3 series · 3 of 3 positions shown · non-contrast
Comparison: None.

CLINICAL DATA: Chronic glass in trash can

EXAM:
RIGHT RING FINGER 2+V

[finger ap]
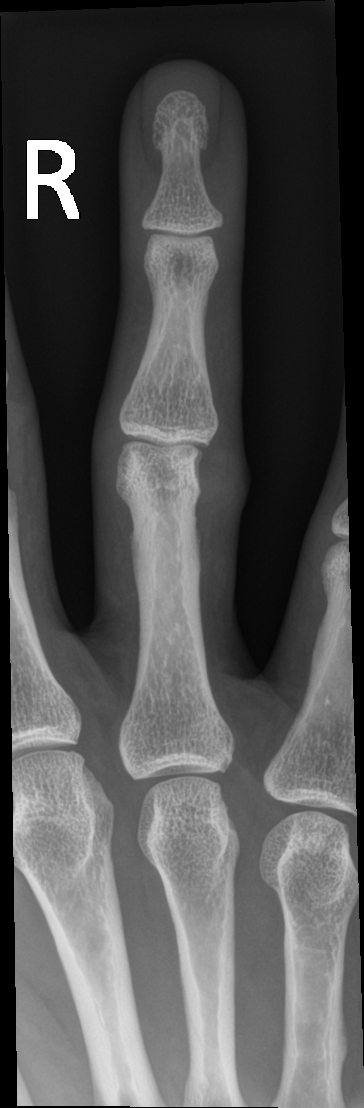

[finger obl]
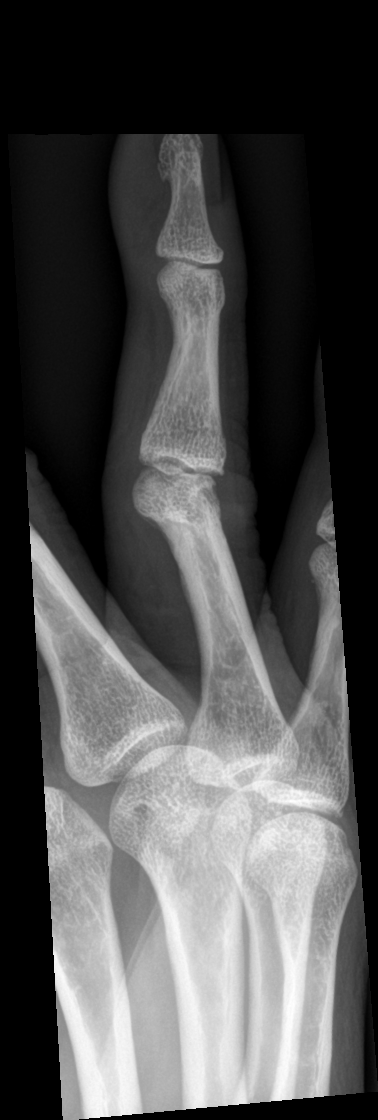

[finger lat]
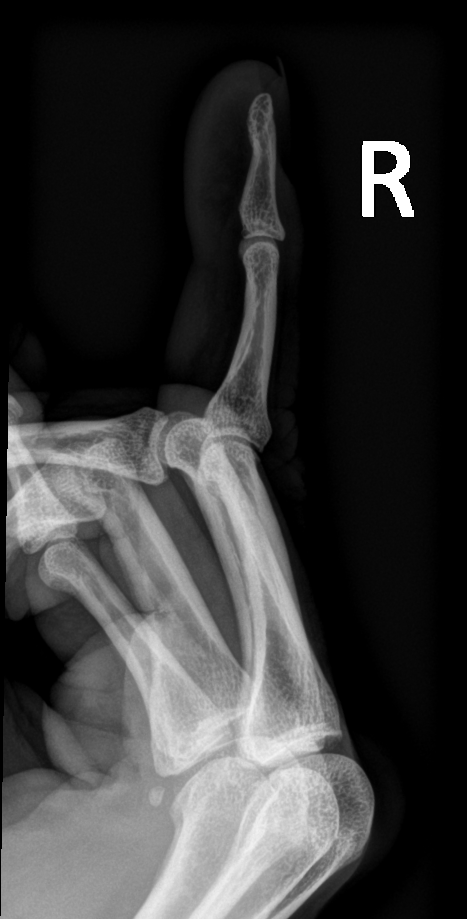

[3 of 3 positions shown; findings below may reference images not displayed]

FINDINGS: There is no acute fracture or dislocation. Bony alignment is normal.
The joint spaces are preserved. There is no erosive change. There is
no soft tissue gas or radiopaque foreign body.
IMPRESSION: No acute fracture or dislocation.  No radiopaque foreign body.
